# Patient Record
Sex: Female | Born: 2000 | Race: Black or African American | Hispanic: No | Marital: Single | Smoking: Never smoker
Health system: Southern US, Community
[De-identification: ages and names within clinical notes are randomized; demographics above are authoritative.]

## PROBLEM LIST (undated history)

## (undated) DIAGNOSIS — G40909 Epilepsy, unspecified, not intractable, without status epilepticus: Secondary | ICD-10-CM

---

## 2021-05-15 ENCOUNTER — Encounter (HOSPITAL_COMMUNITY): Payer: Self-pay

## 2021-05-15 ENCOUNTER — Emergency Department (HOSPITAL_COMMUNITY)
Admission: EM | Admit: 2021-05-15 | Discharge: 2021-05-15 | Disposition: A | Discharge status: Home / Self Care | Payer: Medicaid Other | Attending: Emergency Medicine | Admitting: Emergency Medicine

## 2021-05-15 ENCOUNTER — Emergency Department (HOSPITAL_COMMUNITY): Payer: Medicaid Other

## 2021-05-15 DIAGNOSIS — Y92410 Unspecified street and highway as the place of occurrence of the external cause: Secondary | ICD-10-CM | POA: Insufficient documentation

## 2021-05-15 DIAGNOSIS — M546 Pain in thoracic spine: Secondary | ICD-10-CM | POA: Diagnosis not present

## 2021-05-15 HISTORY — DX: Epilepsy, unspecified, not intractable, without status epilepticus: G40.909

## 2021-05-15 MED ORDER — METHOCARBAMOL 500 MG PO TABS: 500.0000 mg | ORAL_TABLET | Freq: Three times a day (TID) | ORAL | 0 refills | Status: DC | PRN
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
## 2023-07-01 NOTE — ED Provider Notes (Signed)
 EMERGENCY DEPARTMENT PROVIDER NOTE [name]   Patient Name: Bridget Bishop Date of Birth:  Dec 01, 2000 MRN: [id number]  History of Present Illness  HPI  Chief Complaint  Patient presents with   Sore Throat    Pt c/o sore throat X 3 weeks, boyfriend got diagnosed with syphllis recently    HPI patient is a 22 year old female comes in chief complaint of sore throat.  She states been going on for couple weeks.  She states got better for couple days but still does hurt some.  Patient denies any difficulty breathing.  She is able swallow liquids.  She denies any fever.  No sinus symptoms.  Patient also states that her boyfriend just informed her recently that he tested positive for syphilis.  Patient denies any specific complaints of rash or vaginal complaints.  No dysuria.     Medical and Social History  Patient History History reviewed. No pertinent past medical history. Past Surgical History:  Procedure Laterality Date   TONSILLECTOMY SURG HX     No family history on file. Social History   Tobacco Use   Smoking status: Never    Passive exposure: Never   Smokeless tobacco: Never  Substance Use Topics   Alcohol use: No   Drug use: No      Review of Systems  Review of Systems Review of Systems  All other systems reviewed and are negative.     Physical Exam  Physical Exam ED Triage Vitals [07/01/23 1023]  Temperature Heart Rate Resp BP SpO2  36.9 C (98.4 F) 77 18 (!) 137/80 99 %    Temp Source Heart Rate Source Patient Position BP Location FiO2 (%)  Oral Monitor -- Left arm --   Physical Exam Vitals and nursing note reviewed.  Constitutional:      Appearance: She is well-developed.  Eyes:     Conjunctiva/sclera: Conjunctivae normal.     Pupils: Pupils are equal, round, and reactive to light.  Cardiovascular:     Rate and Rhythm: Normal rate and regular rhythm.     Heart sounds: Normal heart sounds. No murmur  heard. Pulmonary:     Breath sounds: Normal breath sounds. No stridor. No wheezing or rales.  Abdominal:     General: There is no distension.     Palpations: Abdomen is soft.     Tenderness: There is no abdominal tenderness. There is no guarding or rebound.  Musculoskeletal:        General: No tenderness or deformity. Normal range of motion.     Cervical back: Neck supple.  Skin:    General: Skin is warm and dry.     Capillary Refill: Capillary refill takes less than 2 seconds.     Findings: No rash.  Neurological:     Mental Status: She is alert and oriented to person, place, and time.     Cranial Nerves: No cranial nerve deficit.     Coordination: Coordination normal.        ED Course and Medical Decision Making   Medical Decision Making  Patient negative strep test.  She was given Bicillin LA 2.4 million units IM.  This would cover her if she does test positive for syphilis.  Serologies were sent.  Patient is well-appearing nontoxic.  She is stable for discharge and outpatient management.  Follow up her primary care physician or the [facility] was recommended. ED Course as of 07/01/23 1107  Chad Listrom's Documentation  Fri Jul 01, 2023  1103 Strep test negative      Clinical Impressions as of 07/01/23 1107  Pharyngitis  Exposure to syphilis    ED Disposition  Discharge   ED Discharge Medications   ED Prescriptions   None    Only medications prescribed or modified are during this ED encounter are included in this list.    Chad Listrom, MD 07/01/23 [phone]

## 2023-10-19 NOTE — ED Provider Notes (Signed)
 Patient placed in First Look pathway, seen and evaluated for chief complaint of dysuria x2 days. Denies discharge or bleeding.  Pertinent exam findings include no obvious distress.   Based on initial evaluation, labs are currently indicated and radiology studies are not currently indicated as allowed for current processes and treatments as applicable in a triage setting. Patient counseled on process, plan, and necessity for staying for completing the evaluation.    Note By: Toribio Clam, PA-C 4:12 PM    [facility] Emergency Department Emergency Department Provider Note  This document was created using the aid of voice recognition Dragon dictation software.   Provider at bedside: 10/20/2023 4:41 PM  History obtained from the: Patient  History   Chief Complaint  Patient presents with   Urinary Complaint    HPI  Bridget Bishop is a 23 y.o. female who presents to the ED with complaints of dysuria, onset 2 days ago. Endorses increased urinary frequency. Denies vaginal discharge. Denies abdominal pain/pelvic pain. Unsure if she has had any recent STD exposure. 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    4:41 PM Previous medical records reviewed from [facility] Care Everywhere and EPIC Chart Review.   No LMP recorded.   ROS: Pertinent positives and negatives per HPI. Pertinent past medical, surgical, social and family history records were reviewed. Current Medications and Allergies were reviewed.  Physical Exam   Vitals:   10/19/23 1608 10/19/23 1726  BP: 121/76 111/77  BP Location: Right arm Left arm  Patient Position: Sitting Sitting  Pulse: 101 78  Resp: 18 18  Temp: 98.6 F (37 C) 98.5 F (36.9 C)  TempSrc: Oral Oral  SpO2: 98% 98%  Weight: 72.6 kg (160 lb)   Height: 157.5 cm (5' 2)     Physical Exam Vitals and nursing note reviewed.  Constitutional:      General: She is not in acute distress.     Appearance: Normal appearance.  HENT:     Head: Normocephalic and atraumatic.     Nose: Nose normal.     Mouth/Throat:     Mouth: Mucous membranes are moist.  Eyes:     Extraocular Movements: Extraocular movements intact.     Conjunctiva/sclera: Conjunctivae normal.  Cardiovascular:     Rate and Rhythm: Normal rate.     Pulses: Normal pulses.  Pulmonary:     Effort: Pulmonary effort is normal.  Abdominal:     General: Abdomen is flat.     Palpations: Abdomen is soft.     Tenderness: There is no abdominal tenderness. There is no right CVA tenderness or left CVA tenderness.  Genitourinary:    Comments: deferred Musculoskeletal:        General: Normal range of motion.     Cervical back: Normal range of motion.  Skin:    General: Skin is warm.     Capillary Refill: Capillary refill takes less than 2 seconds.     Findings: No rash.  Neurological:     Mental Status: She is alert.  Psychiatric:        Behavior: Behavior normal.     Results  LABS Labs Reviewed  URINALYSIS WITH REFLEX TO MICROSCOPIC - Abnormal      Result Value   Color, Urine Yellow     Clarity, Urine Clear     Specific Gravity, Urine 1.028 (*)    pH, Urine 6.0     Protein, Urine Negative  Glucose, Urine Negative     Ketones, Urine Negative     Bilirubin, Urine Negative     Blood, Urine Negative     Nitrite, Urine Negative     Leukocyte Esterase, Urine 75 (*)    Urobilinogen, Urine Normal     WBC, Urine 6-12 (*)    RBC, Urine 3-5 (*)    Bacteria, Urine Rare     Squamous Epithelial Cells, Urine 0-5    WET PREP - Abnormal   WBC, Wet Prep Few (*)    Clue Cells, Wet Prep Positive (*)    Trichomonas, Wet Prep Positive (*)    Yeast, Wet Prep Negative    CHLAMYDIA / GONOCOCCUS (GC), NAAT - Abnormal   Chlamydia (CT) Not Detected     Gonorrhea (GC) Detected (*)    Narrative:    The Xpert CT/NG Assay is an automated in vitro diagnostic test for qualitative detection and differentiation of DNA from CT  and NG. The assay is performed on the [employer]. The GeneXpert Instrument Systems automate and integrate sample purification, nucleic acid amplification, and detection of the target sequences in simple or complex samples using real-time PCR and RT-PCR assays.  Xpert CT/NG Assay performance has not been evaluated in patients less than fourteen years of age, in pregnant women, or in patients with a history of hysterectomy.  RAPID PLASMA REAGIN (RPR), QUALITATIVE TEST WITH REFLEX TO TITER AND CONFIRMATION - Abnormal   RPR Reactive (*)    RPR Titer 1:32     Narrative:    This is a PRELIMINARY result and DOES NOT indicate syphilis infection.  A confirmatory test MUST be performed for diagnosis.  Confirmatory testing by TP-PA is automatically reflexed on all NEW reactive samples.  Please see TP-PA test results.  The confirmatory testing is not performed if previously done and confirmed reactive at [facility].   The diagnosis of syphilis should not be made on a single reactive result without the support of a positive history or clinical evidence.  RPR reactive results may also be found in a variety of non-syphilis conditions.  POC HCG QUALITATIVE, URINE (AH) - Normal   HCG, Urine, POC Negative     Internal Control Acceptable     Specific Gravity, Urine       Kit/Device Lot # [id number]     Kit/Device Expiration Date 79739168    URINE CULTURE  TREPONEMA PALLIDUM ANTIBODY, PARTICLE AGGLUTINATION   ED Course     Medications Given in Emergency Department   Medications  cefTRIAXone (ROCEPHIN) injection 500 mg (500 mg intramuscular Given 10/19/23 1753)  lidocaine (XYLOCAINE) 10 mg/mL (1 %) injection 50 mg (1 mL injection Given 10/19/23 1753)     Medical Decision Making  Medical Decision Making Problems Addressed: Bacterial vaginosis: complicated acute illness or injury with systemic symptoms Dysuria: complicated acute illness or injury with systemic symptoms STD exposure:  complicated acute illness or injury with systemic symptoms Trichomonal infection: complicated acute illness or injury with systemic symptoms  Amount and/or Complexity of Data Reviewed External Data Reviewed: notes. Labs: ordered. Decision-making details documented in ED Course.  Risk OTC drugs. Prescription drug management.      Additional Information: I reviewed the patient's past medical history, including notes from our facility and what is available in CareEverywhere.  Differentials considered: DDX: urolithiasis, urehtral stricture/diverticula, vaginitis, cysitis, pyelonephritis, vulvovaginitis, STDs, medication (PCN, topical hygiene, cytoxan), GU cancer, reiter syndrome, SLE, urethral trauma, UTI  On my initial exam, the pt was  hemodynamically stable, non toxic appearing and in NAD. Physical exam as above. Wet prep positive for trichomonas and BV. GC/Chlamydia and syphilis pending at the time of d/c. Urine preg negative. Pt was presumptively treated for gonorrhea with rocephin. Will d/c home on doxycycline and flagyl. Stable for d/c.      The following decision tools were used to help assess clinical picture and make decisions: pts history, exam, labs   ED Clinical Impression   Clinical Complexity Number and complexity of problems addressed: Patient's presentation is most consistent with acute illness / injury with systematic symptoms.  Amount and/or complexity of data reviewed, analyzed: Assessment requiring independent historians used:  External notes reviewed: Prior PCP visit, prior emergency department visits from outside hospital, prior subspecialist consultation visit, prior hospitalization  Lab results personally reviewed by me and considered in medical decision making of this patient's treatment and disposition, and pertinent information noted in ED course  Radiology imaging reviewed and interpreted personally by me and pertinent information noted in ED Course  All  radiology studies reviewed independently, additionally reading provided by radiologist available, unless otherwise noted.  Plan: Discharge patient home with prescriptions for doxycycline and Flagyl.  Safe sex education given.  Follow-up with health department for further testing.  Return precautions given.  Clinical picture was discussed with patient along with risks and benefits of management options, shared decision making we will discharge the patient home with close outpatient follow-up for continued management. Based on the above findings, I believe patient is hemodynamically stable for discharge.   The following prescriptions were given for continued management of symptoms or pathologies:flagyl and doxycyline    Patient/and family educated about specific return precautions for given chief complaint and symptoms.  Patient/and family educated about follow-up with PCP and health department.  Patient/and family expressed understanding of return precautions and need for follow-up.  Patient discharged. Diagnosis, treatment, plan discussed with patient.  All questions were answered to the patient's satisfaction.   If patient was given medication(s), adverse effects, black box warnings, and drug interactions were discussed with patient.  Patient instructed to follow-up here in the emergency department for new or worsening symptoms.     1. Dysuria   2. Trichomonal infection   3. Bacterial vaginosis   4. STD exposure      Medication List     START taking these medications    doxycycline 100 mg tablet Commonly known as: VIBRA-TABS Take 1 tablet (100 mg total) by mouth 2 (two) times a day for 7 days. Take with 8 oz water. Do not lie down for at least 30 minutes after.   metroNIDAZOLE 500 mg tablet Commonly known as: FLAGYL Take 1 tablet (500 mg total) by mouth 2 (two) times a day for 7 days.       ASK your doctor about these medications    labetaloL 100 mg tablet Commonly known as:  NORMODYNE Take 100 mg by mouth 2 (two) times a day.   NIFEdipine 30 mg 24 hr tablet Commonly known as: PROCARDIA XL Take 30 mg by mouth Once Daily.   prenatal vitamin-iron fumurate-folic acid 28 mg iron- 800 mcg tablet Take  by mouth.         Where to Get Your Medications     These medications were sent to [facility] [address], [place] - [address] - PHONE: [phone] - FAX: [phone]  [address], [place] [address]    Phone: [phone]  doxycycline 100 mg tablet metroNIDAZOLE  500 mg tablet    FOLLOW UP [facility] -  EMERGENCY DEPARTMENT [address] [place]  [address] [phone] Go to  If symptoms worsen, As needed  _____________________________  Attestation: Charleen Fox, PA-C obtained and performed the history, physical exam and medical decision making elements that were entered into the chart.  10/20/2023  11:51 AM

## 2023-10-21 NOTE — ED Notes (Addendum)
 Attempted to reach Bridget Bishop by phone to discuss gonorrhea result.  Called patient at number listed to provide further information, no answer. Left message to return call to the Nurse Resource Line. Resource letter will be mailed to address provided.  Patient WAS treated for gonorrhea at the time of her visit. Patient tested + for syphilis approx 3 months ago and was tx with bicillin at that time per chart.  Chlamydia / Gonococcus [facility]), NAAT Order: [id number]  Status: Final result   Test Result Released: Yes (not seen)      Component Ref Range & Units (hover) 2 d ago  Chlamydia (CT) Not Detected  Gonorrhea (GC) Detected Abnormal   Resulting Agency [facility] <redacted file path>         Narrative Performed by: [facility] <redacted file path> The Xpert CT/NG Assay is an automated in vitro diagnostic test for qualitative detection and differentiation of DNA from CT and NG. The assay is performed on the [employer]. The GeneXpert Instrument Systems automate and integrate sample purification, nucleic acid amplification, and detection of the target sequences in simple or complex samples using real-time PCR and RT-PCR assays.  Xpert CT/NG Assay performance has not been evaluated in patients less than fourteen years of age, in pregnant women, or in patients with a history of hysterectomy.  Specimen Collected: 10/19/23 16:46 EDT Last Resulted: 10/19/23 18:33 EDT    Treponema pallidum Antibody, Particle Agglutination Order: [id number] - Reflex for Order [id number]  Status: Final result   Test Result Released: Yes (not seen)      Component Ref Range & Units (hover) 2 d ago  Treponema pallidum Antibody Reactive Abnormal   Resulting Agency [facility] <redacted file path>         Narrative Performed by: [facility] <redacted file path> A reactive treponemal test indicates past or present infection and usually remains reactive for life.  Specimen Collected: 10/19/23 16:51 EDT Last  Resulted: 10/21/23 15:03 EDT        Alan Vicci Oas, RN 10/21/23 1516    Alan Vicci Walker, [place] 10/21/23 [phone]
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
## 2024-08-06 ENCOUNTER — Ambulatory Visit: Payer: MEDICAID

## 2024-08-06 VITALS — BP 134/84 | HR 98 | Temp 98.5°F | Resp 97 | Ht 62.0 in | Wt 167.2 lb

## 2024-08-06 DIAGNOSIS — Z139 Encounter for screening, unspecified: Secondary | ICD-10-CM

## 2024-08-06 DIAGNOSIS — F419 Anxiety disorder, unspecified: Secondary | ICD-10-CM | POA: Diagnosis not present

## 2024-08-06 MED ORDER — HYDRALAZINE HCL 10 MG PO TABS: 10.0000 mg | ORAL_TABLET | Freq: Three times a day (TID) | ORAL | 2 refills | Status: AC | PRN

## 2024-08-06 NOTE — Progress Notes (Signed)
 "  New Patient Office Visit  Subjective    Patient ID: Bridget Bishop, female    DOB: August 19, 2000  Age: 24 y.o. MRN: [id number]  CC:  Chief Complaint  Patient presents with   Establish Care    Pt in today to establish care. The patient reports she is unsure of her last PCP and has not had an OV in 2 years.     HPI Bridget Bishop presents to establish care.Patient is a 24 year old female who presnts today in office to establish care. She has not had a steady primary care doctor. Noted to have previous ER visits. She reports she has a four year old child. She is unmarried. Currently works for a it consultant. She is mostly satisfied with her place of employment. Her current medical history is negative. She had one unspecified abdominal surgery approximately three years ago. She is not on any current medications. She is presently up to date on her immunization history however she did receive the influenza shot for this year.  Last pap smear was performed about one year ago. She reports having anxiety but is not currently one medication. She attributes it to social anxiety in relation to having to stand up and talk. She is not anxious any other time. She denies any current issues.   Outpatient Encounter Medications as of 08/06/2024  Medication Sig   [DISCONTINUED] methocarbamol  (ROBAXIN ) 500 MG tablet Take 1 tablet (500 mg total) by mouth every 8 (eight) hours as needed for muscle spasms.   No facility-administered encounter medications on file as of 08/06/2024.    History reviewed. No pertinent past medical history.  History reviewed. No pertinent surgical history.  Family History  Problem Relation Age of Onset   Diabetes Mother    Heart murmur Father    Hypertension Father    Healthy Sister    Healthy Brother     Social History   Socioeconomic History   Marital status: Single    Spouse name: Not on file   Number of children: Not on file   Years of education: Not  on file   Highest education level: Not on file  Occupational History   Not on file  Tobacco Use   Smoking status: Never   Smokeless tobacco: Never  Vaping Use   Vaping status: Never Used  Substance and Sexual Activity   Alcohol use: Yes    Comment: Occasional/Social   Drug use: Never   Sexual activity: Yes  Other Topics Concern   Not on file  Social History Narrative   Not on file   Social Drivers of Health   Tobacco Use: Low Risk (08/06/2024)   Patient History    Smoking Tobacco Use: Never    Smokeless Tobacco Use: Never    Passive Exposure: Not on file  Financial Resource Strain: Not on file  Food Insecurity: Not on file  Transportation Needs: Not on file  Physical Activity: Not on file  Stress: Not on file  Social Connections: Not on file  Intimate Partner Violence: Not on file  Depression (EYV7-0): Not on file  Alcohol Screen: Not on file  Housing: Not on file  Utilities: Not on file  Health Literacy: Not on file    Review of Systems  Constitutional: Negative.   HENT:  Positive for congestion.   Eyes: Negative.   Respiratory: Negative.    Cardiovascular: Negative.   Gastrointestinal: Negative.   Genitourinary: Negative.   Musculoskeletal: Negative.   Skin:  Negative.   Neurological: Negative.   Endo/Heme/Allergies: Negative.   Psychiatric/Behavioral:  The patient is nervous/anxious.         Objective    BP 134/84   Pulse 98   Temp 98.5 F (36.9 C) (Temporal)   Resp (!) 97   Ht 5' 2 (1.575 m)   Wt 167 lb 3.2 oz (75.8 kg)   LMP 08/05/2024 (Exact Date)   HC 16 (40.6 cm)   BMI 30.58 kg/m   Physical Exam Constitutional:      Appearance: Normal appearance.  HENT:     Head: Normocephalic.     Nose: Rhinorrhea present.     Mouth/Throat:     Mouth: Mucous membranes are moist.  Cardiovascular:     Rate and Rhythm: Normal rate and regular rhythm.     Pulses: Normal pulses.     Heart sounds: Normal heart sounds.  Pulmonary:     Effort:  Pulmonary effort is normal.     Breath sounds: Normal breath sounds.  Abdominal:     General: Abdomen is flat. Bowel sounds are normal.  Musculoskeletal:        General: Normal range of motion.     Cervical back: Normal range of motion.  Skin:    General: Skin is warm.  Neurological:     General: No focal deficit present.     Mental Status: She is alert and oriented to person, place, and time.  Psychiatric:        Mood and Affect: Mood normal.        Behavior: Behavior normal.        Thought Content: Thought content normal.        Judgment: Judgment normal.         Assessment & Plan:   Problem List Items Addressed This Visit       Other   New patient screening performed - Primary   Anxiety   Start Hydralazine  10 mg TID PRN for anxiety. Take first dose at night due to potential insomnia.       Return in about 3 months (around 11/04/2024), or Or sooner if need.   Austine Cork, FNP   "

## 2024-08-06 NOTE — Assessment & Plan Note (Signed)
 Start Hydralazine  10 mg TID PRN for anxiety. Take first dose at night due to potential insomnia.

## 2024-11-05 ENCOUNTER — Ambulatory Visit: Payer: MEDICAID
